# Patient Record
Sex: Female | Born: 1970 | Race: White | Hispanic: No | Marital: Single | Smoking: Current every day smoker
Health system: Southern US, Academic
[De-identification: ages and names within clinical notes are randomized; demographics above are authoritative.]

## PROBLEM LIST (undated history)

## (undated) DIAGNOSIS — G510 Bell's palsy: Secondary | ICD-10-CM

## (undated) DIAGNOSIS — I10 Essential (primary) hypertension: Secondary | ICD-10-CM

## (undated) HISTORY — DX: Bell's palsy: G51.0

## (undated) HISTORY — DX: Essential (primary) hypertension: I10

## (undated) HISTORY — PX: NO PAST SURGERIES: SHX2092

---

## 2022-07-21 ENCOUNTER — Emergency Department
Admission: EM | Admit: 2022-07-21 | Discharge: 2022-07-21 | Disposition: A | Discharge status: Home / Self Care | Payer: Self-pay | Attending: Physician Assistant | Admitting: Physician Assistant

## 2022-07-21 ENCOUNTER — Other Ambulatory Visit: Payer: Self-pay

## 2022-07-21 ENCOUNTER — Encounter (HOSPITAL_COMMUNITY): Payer: Self-pay | Admitting: Physician Assistant

## 2022-07-21 ENCOUNTER — Emergency Department (HOSPITAL_COMMUNITY): Payer: Self-pay

## 2022-07-21 DIAGNOSIS — G510 Bell's palsy: Secondary | ICD-10-CM | POA: Insufficient documentation

## 2022-07-21 DIAGNOSIS — G508 Other disorders of trigeminal nerve: Secondary | ICD-10-CM | POA: Insufficient documentation

## 2022-07-21 DIAGNOSIS — R9431 Abnormal electrocardiogram [ECG] [EKG]: Secondary | ICD-10-CM | POA: Insufficient documentation

## 2022-07-21 DIAGNOSIS — I10 Essential (primary) hypertension: Secondary | ICD-10-CM | POA: Insufficient documentation

## 2022-07-21 LAB — COMPREHENSIVE METABOLIC PANEL, NON-FASTING
ALBUMIN/GLOBULIN RATIO: 1.3 (ref 0.8–1.4)
ALBUMIN: 4.4 g/dL (ref 3.5–5.7)
ALKALINE PHOSPHATASE: 89 U/L (ref 34–104)
ALT (SGPT): 19 U/L (ref 7–52)
ANION GAP: 6 mmol/L (ref 4–13)
AST (SGOT): 17 U/L (ref 13–39)
BILIRUBIN TOTAL: 0.5 mg/dL (ref 0.3–1.2)
BUN/CREA RATIO: 17 (ref 6–22)
BUN: 11 mg/dL (ref 7–25)
CALCIUM, CORRECTED: 8.9 mg/dL (ref 8.9–10.8)
CALCIUM: 9.3 mg/dL (ref 8.6–10.3)
CHLORIDE: 107 mmol/L (ref 98–107)
CO2 TOTAL: 27 mmol/L (ref 21–31)
CREATININE: 0.63 mg/dL (ref 0.60–1.30)
ESTIMATED GFR: 109 mL/min/{1.73_m2} (ref >59)
GLOBULIN: 3.3 (ref 2.9–5.4)
GLUCOSE: 124 mg/dL — ABNORMAL HIGH (ref 74–109)
OSMOLALITY, CALCULATED: 280 mOsm/kg (ref 270–290)
POTASSIUM: 3.6 mmol/L (ref 3.5–5.1)
PROTEIN TOTAL: 7.7 g/dL (ref 6.4–8.9)
SODIUM: 140 mmol/L (ref 136–145)

## 2022-07-21 LAB — CBC WITH DIFF
BASOPHIL #: 0.1 10*3/uL (ref 0.00–0.30)
BASOPHIL %: 1 % (ref 0–3)
EOSINOPHIL #: 0.2 10*3/uL (ref 0.00–0.80)
EOSINOPHIL %: 3 % (ref 0–7)
HCT: 41.7 % (ref 37.0–47.0)
HGB: 14.3 g/dL (ref 12.5–16.0)
LYMPHOCYTE #: 1.8 10*3/uL (ref 1.10–5.00)
LYMPHOCYTE %: 25 % (ref 25–45)
MCH: 28.1 pg (ref 27.0–32.0)
MCHC: 34.2 g/dL (ref 32.0–36.0)
MCV: 82.2 fL (ref 78.0–99.0)
MONOCYTE #: 0.4 10*3/uL (ref 0.00–1.30)
MONOCYTE %: 6 % (ref 0–12)
MPV: 7.6 fL (ref 7.4–10.4)
NEUTROPHIL #: 4.8 10*3/uL (ref 1.80–8.40)
NEUTROPHIL %: 66 % (ref 40–76)
PLATELETS: 284 10*3/uL (ref 140–440)
RBC: 5.07 10*6/uL (ref 4.20–5.40)
RDW: 14.6 % (ref 11.6–14.8)
WBC: 7.3 10*3/uL (ref 4.0–10.5)
WBCS UNCORRECTED: 7.3 10*3/uL

## 2022-07-21 LAB — URINALYSIS, MACROSCOPIC
BILIRUBIN: NEGATIVE mg/dL
BLOOD: 0.03 mg/dL
GLUCOSE: NEGATIVE mg/dL
KETONES: NEGATIVE mg/dL
LEUKOCYTES: NEGATIVE WBCs/uL
NITRITE: NEGATIVE
PH: 6 (ref 5.0–9.0)
PROTEIN: 10 mg/dL
SPECIFIC GRAVITY: 1.021 (ref 1.002–1.030)
UROBILINOGEN: NORMAL mg/dL

## 2022-07-21 LAB — PTT (PARTIAL THROMBOPLASTIN TIME): APTT: 32 seconds (ref 26.0–36.0)

## 2022-07-21 LAB — PT/INR
INR: 1.07 (ref <=5.00)
PROTHROMBIN TIME: 12.4 seconds (ref 9.8–12.7)

## 2022-07-21 LAB — URINALYSIS, MICROSCOPIC
RBCS: 3 /hpf (ref <4)
SQUAMOUS EPITHELIAL: 1 /hpf (ref <28)
WBCS: 1 /hpf (ref <6)

## 2022-07-21 LAB — GOLD TOP TUBE

## 2022-07-21 LAB — TROPONIN-I: TROPONIN I: 5 ng/L (ref <15)

## 2022-07-21 MED ORDER — HYDROCHLOROTHIAZIDE 25 MG TABLET
ORAL_TABLET | ORAL | Status: AC
Start: 2022-07-21 — End: 2022-07-21
  Filled 2022-07-21: qty 1

## 2022-07-21 MED ORDER — CLONIDINE HCL 0.1 MG TABLET
ORAL_TABLET | ORAL | Status: AC
Start: 2022-07-21 — End: 2022-07-21
  Filled 2022-07-21: qty 1

## 2022-07-21 MED ORDER — HYDROCHLOROTHIAZIDE 25 MG TABLET
25.0000 mg | ORAL_TABLET | ORAL | Status: AC
Start: 2022-07-21 — End: 2022-07-21
  Administered 2022-07-21: 25 mg via ORAL

## 2022-07-21 MED ORDER — HYDROCHLOROTHIAZIDE 12.5 MG CAPSULE
12.5000 mg | ORAL_CAPSULE | Freq: Every day | ORAL | 0 refills | Status: DC
Start: 2022-07-21 — End: 2022-08-28

## 2022-07-21 MED ORDER — CLONIDINE HCL 0.1 MG TABLET
0.1000 mg | ORAL_TABLET | ORAL | Status: AC
Start: 2022-07-21 — End: 2022-07-21
  Administered 2022-07-21: 0.1 mg via ORAL

## 2022-07-21 NOTE — ED Nurses Note (Signed)
Patient discharged to home. A written copy of discharge instructions was given to the patient. Questions sufficiently answered as needed. Patient encouraged to follow up with PCP as indicated. Patient left department via ambulation.

## 2022-07-21 NOTE — ED Triage Notes (Signed)
States she has had numbness to left side of face since Saturday and numbness to tongue. Denies weakness or numbness anywhere else. Denies any confusion or slurred speech.

## 2022-07-21 NOTE — Discharge Instructions (Addendum)
Apply rewetting drops to the left eye throughout the day.  Tape the eye shut when you are sleeping.  Obtain a blood pressure cuff and check your blood pressure daily.  Keep a log.  Call schedule an appointment with a primary care provider listed above.  Return to the ER if any symptoms worsen or change.

## 2022-07-21 NOTE — ED Nurses Note (Signed)
Patient to EDM via ambulation with complaints of left sided facial numbness. Patient states on Saturday her tongue was numb and now her tongue, left side of face, and left side of neck are now numb. Patient denies any chest pain, SOB, headache, blurry vision, or slurred speech currently. Patient placed on bedside monitoring. Assessment as documented. Call bell in reach.

## 2022-07-21 NOTE — ED APP Handoff Note (Signed)
[facility]  Emergency Department  Provider in Triage Note    Name: Rhylei Mcquaig  Age: 51 y.o.  Gender: female     Subjective:   Carolyn Sylvia is a 51 y.o. female who presents with complaint of Numbness  .  Pt c/o L facial numbness X 3 days. Pt denies headache, visual changes, or numbness in further extremities.     Objective:   There were no vitals filed for this visit.   Focused Physical Exam shows NIH-0. PEARLA. Alert and oriented X 4. Pt is hypertensive in triage.       Plan:  Please see initial orders and work-up below.  This is to be continued with full evaluation in the main Emergency Department.     No current facility-administered medications for this encounter.     Results for orders placed or performed during the hospital encounter of 07/21/22 (from the past 24 hour(s))   CBC/DIFF    Narrative    The following orders were created for panel order CBC/DIFF.  Procedure                               Abnormality         Status                     ---------                               -----------         ------                     CBC WITH OJJK[[id number]]                                                                 Please view results for these tests on the individual orders.   URINALYSIS, MACROSCOPIC AND MICROSCOPIC W/CULTURE REFLEX    Specimen: Urine, Site not specified    Narrative    The following orders were created for panel order URINALYSIS, MACROSCOPIC AND MICROSCOPIC W/CULTURE REFLEX.  Procedure                               Abnormality         Status                     ---------                               -----------         ------                     URINALYSIS, MACROSCOPIC[[id number]]                                                       Please view results for  these tests on the individual orders.        Sidney Ace, FNP-C

## 2022-07-21 NOTE — ED Provider Notes (Signed)
[facility]  ED Primary Provider Note  History of Present Illness   Chief Complaint   Patient presents with    Numbness     Jodi Ramsey is a 51 y.o. female who had concerns including Numbness.  Arrival: The patient arrived by Car    Patient presents with complaints of numbness to the left side of the face in the left tongue that began 2 days ago.  Yesterday she developed weakness of the left side of the face.  She denies history of similar symptoms.  She denies any weakness or sensory changes of the extremities.  Denies fever, chills, sweats, chest pain, shortness a breath, nausea, vomiting, diarrhea, constipation, abdominal pain, abnormal bruising or bleeding, rash, headache, visual changes, dizziness.  She does not follow with a primary care provider and takes no medications on a daily basis.  She is no history of HSV, EBV, recent tick bite, hx of CVA or HTN.        Review of Systems   Pertinent positive and negative ROS as per HPI.  Historical Data   History Reviewed This Encounter: Medical History  Surgical History  Family History  Social History    Physical Exam   ED Triage Vitals [07/21/22 1226]   BP (Non-Invasive) (!) 220/99   Heart Rate 90   Respiratory Rate 20   Temperature 36.1 C (96.9 F)   SpO2 98 %   Weight 107 kg (235 lb)   Height 1.6 m (_0 )     Physical Exam  General: WDWN, appears stated age, alert in NAD. Cooperative throughout the exam.  Eyes:  no orbital edema or erythema, PERRL, EOMI, no scleral icterus or conjunctival injection,left eye lid doesn't close fully with normal blink response  Ears: External ear and EAC without erythema, edema or deformity. TM is clear with appropriate landmarks and without erythema or effusion.  Nose: Nares patent without discharge. Turbinants appear normal size and color.   Throat: Mucosa moist, soft and hard palate without erythema or lesions. Uvula midline. Tonsils and posterior pharyngeal wall are without edema,  erythema or exudate.    Neck: Supple and without LAD  CV: RRR, crisp S1, S2, no extra heart sounds,   Resp: LCTAB, no respiratory distress  Abdomen: normal contour without distension, BS x 4 normal, soft, nontender to light and deep palpation, without guarding or rebound tenderness  MSK: moves all extremities, muscle strength 5/5 throughout  Skin: warm and intact, of normal color, no lesions noted. Small red petechial lesions noted of the LE. Pt reports this is due to scratching from an allergy to socks and insects.   Neuro: normal tone, no involuntary movements, GCS 4,5,6. CN II-XII intact except for left CN V with decreased sensation to light touch in all 3 zones and left CN VII with decreased facial muscle strength on the entire left side.  Psych: speech and affect are appropriate, thought processes intact    Patient Data     Labs Ordered/Reviewed   COMPREHENSIVE METABOLIC PANEL, NON-FASTING - Abnormal; Notable for the following components:       Result Value    GLUCOSE 124 (*)     All other components within normal limits    Narrative:     Estimated Glomerular Filtration Rate (eGFR) is calculated using the CKD-EPI (2021) equation, intended for patients 58 years of age and older. If gender is not documented or "unknown", there will be no eGFR calculation.   URINALYSIS, MICROSCOPIC -  Abnormal; Notable for the following components:    MUCOUS Occasional (*)     All other components within normal limits   PT/INR - Normal    Narrative:     INR OF 2.0-3.0  RECOMMENDED FOR: PROPHYLAXIS/TREATMENT OF VENEOUS THROMBOSIS, PULMONARY EMBOLISM, PREVENTION OF SYSTEMIC EMBOLISM FROM ATRIAL FIBRILATION, MYOCARDIAL INFARCTION.    INR OF 2.5-3.5  RECOMMENDED FOR MECHANICAL PROSTHETIC HEART VALVES, RECURRENT SYSTEMIC EMBOLISM, RECURRENT MYOCARDIAL INFARCTION.     PTT (PARTIAL THROMBOPLASTIN TIME) - Normal   TROPONIN-I - Normal   URINALYSIS, MACROSCOPIC - Normal   CBC/DIFF    Narrative:     The following orders were created for panel  order CBC/DIFF.  Procedure                               Abnormality         Status                     ---------                               -----------         ------                     CBC WITH BSJG[[id number]]                                    Final result                 Please view results for these tests on the individual orders.   URINALYSIS, MACROSCOPIC AND MICROSCOPIC W/CULTURE REFLEX    Narrative:     The following orders were created for panel order URINALYSIS, MACROSCOPIC AND MICROSCOPIC W/CULTURE REFLEX.  Procedure                               Abnormality         Status                     ---------                               -----------         ------                     URINALYSIS, MACROSCOPIC[[id number]]      Normal              Final result               URINALYSIS, MICROSCOPIC[[id number]]      Abnormal            Final result                 Please view results for these tests on the individual orders.   CBC WITH DIFF   EXTRA TUBES    Narrative:     The following orders were created for panel order EXTRA TUBES.  Procedure  Abnormality         Status                     ---------                               -----------         ------                     GOLD TOP HERD[[id number]]                                    Final result                 Please view results for these tests on the individual orders.   GOLD TOP TUBE   PERFORM POC WHOLE BLOOD GLUCOSE     CT BRAIN WO IV CONTRAST   Final Result by Edi, Radresults In (08/21 1440)   NO ACUTE FINDINGS         One or more dose reduction techniques were used (e.g., Automated exposure control, adjustment of the mA and/or kV according to patient size, use of iterative reconstruction technique).         Radiologist location ID: [id number]         XR AP MOBILE CHEST   Final Result by Edi, Radresults In (08/21 1329)   NO ACUTE FINDINGS.         Radiologist location ID: [id number]         Medical Decision Making  Upon arrival patient was placed in a bed and evaluated with the history and physical exam.  Differential diagnosis includes Bell's palsy, trigeminal neuropathy, intracranial mass, CVA, hypertension, hypertensive urgency, hypertensive emergency.  Initial EKG and troponin are unremarkable.  Urinalysis shows no proteinuria.  Chemistry is unremarkable for kidney damage, CT of the brain is unremarkable indicating no hypertensive emergency.  No evidence of ischemic changes on noncontrasted CT.  Chest x-ray and CBC are unremarkable.  Patient's blood pressure remained elevated in the emergency room and therefore 0.1 mg of clonidine was administered.  Within 1 hour the blood pressure remained the same and therefore 25 mg of hydrochlorothiazide was administered .  The patient remained stable throughout ED course.    Amount and/or Complexity of Data Reviewed  Radiology:  Decision-making details documented in ED Course.  ECG/medicine tests: independent interpretation performed. Decision-making details documented in ED Course.    Risk  Prescription drug management.      ED Course as of 07/21/22 2019   Mon Jul 21, 2022   1234 ECG 12 LEAD  Normal sinus rhythm with a rate of 87.  No PR QRS or QT prolongation.  Right axis deviation.  No acute ST or T-wave changes.     1715 URINALYSIS, MACROSCOPIC AND MICROSCOPIC W/CULTURE REFLEX(!)  Unremarkable   1715 TROPONIN-I: 5   1715 aPTT: 32.0   1715 COMPREHENSIVE METABOLIC PANEL, NON-FASTING(!)  Unremarkable   1715 PROTHROMBIN TIME: 12.4   1715 INR: 1.07   1715 CBC/DIFF  Unremarkable     1717 ECG 12 LEAD   1718 CT BRAIN WO IV CONTRAST  IMPRESSION:  NO ACUTE FINDINGS     1718 XR AP MOBILE CHEST  IMPRESSION:  NO ACUTE FINDINGS.  Medications Administered in the ED   cloNIDine (CATAPRES) tablet (0.1 mg Oral Given 07/21/22 1745)   hydroCHLOROthiazide (HYDRODIURIL) tablet (25 mg Oral Given 07/21/22 1848)     Clinical Impression   Bell's palsy (Primary)    Trigeminal anesthesia   Hypertension, unspecified type       Disposition: Discharged

## 2022-07-22 LAB — ECG 12 LEAD
Atrial Rate: 87 {beats}/min
Calculated P Axis: 84 degrees
Calculated R Axis: 106 degrees
Calculated T Axis: 80 degrees
PR Interval: 164 ms
QRS Duration: 70 ms
QT Interval: 372 ms
QTC Calculation: 447 ms
Ventricular rate: 87 {beats}/min

## 2022-07-31 ENCOUNTER — Other Ambulatory Visit: Payer: Self-pay

## 2022-08-08 ENCOUNTER — Ambulatory Visit (INDEPENDENT_AMBULATORY_CARE_PROVIDER_SITE_OTHER): Payer: Self-pay | Admitting: NEUROLOGY

## 2022-08-28 ENCOUNTER — Encounter (INDEPENDENT_AMBULATORY_CARE_PROVIDER_SITE_OTHER): Payer: Self-pay | Admitting: NEUROLOGY

## 2022-08-31 ENCOUNTER — Other Ambulatory Visit: Payer: Self-pay

## 2022-09-22 ENCOUNTER — Encounter (INDEPENDENT_AMBULATORY_CARE_PROVIDER_SITE_OTHER): Payer: Self-pay | Admitting: NEUROLOGY

## 2022-09-22 ENCOUNTER — Ambulatory Visit (INDEPENDENT_AMBULATORY_CARE_PROVIDER_SITE_OTHER): Payer: Self-pay | Admitting: NEUROLOGY

## 2022-09-22 ENCOUNTER — Other Ambulatory Visit: Payer: Self-pay

## 2022-09-22 VITALS — BP 182/89 | HR 99 | Temp 98.3°F | Ht 63.0 in | Wt 220.2 lb

## 2022-09-22 DIAGNOSIS — I10 Essential (primary) hypertension: Secondary | ICD-10-CM

## 2022-09-22 DIAGNOSIS — G510 Bell's palsy: Secondary | ICD-10-CM

## 2022-09-22 DIAGNOSIS — R202 Paresthesia of skin: Secondary | ICD-10-CM

## 2022-09-22 NOTE — Progress Notes (Signed)
ASSESSMENT  1. LEFT-SIDED SENSORY DEFICITS:  She has a history of what sounds like progressive left side and sensory deficits beginning with an episode of neck and shoulder pain with radiating pain into the left upper extremity that then gave way to numbness and paresthesias throughout the arm.  This has been followed by numbness and paresthesias of the left lower extremity more recently.  Pin sensation is reduced throughout the left side.  There was no evidence of a sensory level.  Reflexes are relatively symmetric aside from the left Achilles which is reduced compared to right.  Given the completeness of the sensory deficits, localization would be concerning for cervical spine or brain.  While while the majority of her symptoms seemed to have had a subacute onset, I would still be concerned about a vascular event given her hypertensive urgency.  Is not inconceivable that she may have had a small right thalamic infarct affecting only sensation of the left side.  Another consideration would be inflammatory lesion of the brain or cervical spine given the acute to subacute onset.  I discussed that ultimately I would like to get an MRI of the brain and cervical spine.  Unfortunately, she is currently self pay.  She has had an employment change in may be eligible for Medicaid which she is looking into.  I will hold off on it imaging until insurances sorted.  In the meantime, I will schedule her for an EMG as cervical and lumbosacral radiculopathy is still high on the differential.  2. LEFT BELL'S PALSY:  Her acute onset left facial weakness is consistent with Bell's palsy as it was also accompanied by hyperacusis of the left ear.  This has improved quite a bit, however, there is still some notable weakness in the upper face.  I discussed that this may continue to improve up to 12 sometimes 18 months out from initial insult.  It is important to note, however, that many times recovery from Bell's palsy is  incomplete.    PLAN  Obtain NCS/EMG        We will plan to obtain an MRI of the brain cervical spine once     patient has insurance  2.   Continue to monitor    Follow-up to be determined at EMG visit    Thank you for allowing me to participate in your patient's care and please do not hesitate to contact me for any questions or concerns.    Patrick North, DO  Assistant Professor of Neurology  [facility]     I personally spent a total of 45 minutes today preparing to see the patient, in the encounter with the patient, and documenting after the visit.   ==========================================================================================================================================    NAME:  Jodi Ramsey  DOB:  [birth year]  VISIT DATE:  09/22/2022    CC:  Bell's Palsy    Patient seen in consultation at the request of Dr. Arva Chafe  History obtained from the patient and chart/records  Age of patient:  51 y.o.      HPI:   I had the pleasure of seeing your patient in neurology clinic for an outpatient consultation, who is a 51 y.o. year old female who was referred for evaluation of Bell's Palsy.  Please allow me to summarize the history for the record.    She states that her symptoms began back in July.  She began having significant left shoulder pain that radiated into the left arm.  Pain eventually subsided and was replaced with numbness and tingling throughout the left upper extremity.  This has persisted since that time.  At the end of August, she began developing left facial weakness that was preceded by tingling sensation in the tongue and accompanied by hyperacusis of the left ear.  This worsened over about a 2-3 days.  At which time she went to the ER.  She was found to have blood pressures with systolics in the 220s.  CT imaging was unremarkable.  No MRI was completed.  She then followed up with Dr. Kalman Jewels who diagnosed her with Bell's palsy and gave  her a steroid taper.  Since that time facial weakness has improved quite a bit though she reports it is not back to normal.  Two weeks following the Bell's palsy event, she began developing similar numbness and tingling in the left leg that has occurred in the left arm.  She denies any pain as occurred with the left upper extremity symptoms.  She denies any weakness.    ============================================================================================================================================  PMHx  Patient Active Problem List   Diagnosis    HTN (hypertension)    Bell's palsy     History reviewed. No past surgical history pertinent negatives.      Family Medical History:    None         Current Outpatient Medications   Medication Sig Dispense Refill    hydroCHLOROthiazide (HYDRODIURIL) 25 mg Oral Tablet Take 1 Tablet (25 mg total) by mouth Once a day       No current facility-administered medications for this visit.     No Known Allergies  Social History     Socioeconomic History    Marital status: Significant Other     Spouse name: Not on file    Number of children: Not on file    Years of education: Not on file    Highest education level: Not on file   Occupational History    Not on file   Tobacco Use    Smoking status: Every Day     Packs/day: .25     Types: Cigarettes    Smokeless tobacco: Never   Vaping Use    Vaping Use: Never used   Substance and Sexual Activity    Alcohol use: Never    Drug use: Never    Sexual activity: Not on file   Other Topics Concern    Not on file   Social History Narrative    Not on file     Social Determinants of Health     Financial Resource Strain: Not on file   Transportation Needs: Not on file   Social Connections: Not on file   Intimate Partner Violence: Not on file   Housing Stability: Not on file       ============================================================================================================================================  GENERAL  EXAMINATION  BP (!) 182/89 (Site: Right, Patient Position: Sitting, Cuff Size: Adult)   Pulse 99   Temp 36.8 C (98.3 F) (Temporal)   Ht 1.6 m (5\' 3" )   Wt 99.9 kg (220 lb 3.2 oz)   SpO2 96%   BMI 39.01 kg/m       Vital signs personally reviewed  General: No acute distress, alert  HEENT: Normocephalic, no scleral icterus  Pulmonary: No accessory muscle use, no tachypnea  Cardiovascular: Heart with regular rate & rhythm  Extremities: No significant edema, No cyanosis    NEUROLOGIC EXAM  On neurological exam, patient was awake, alert and answering questions appropriately  Speech was fluent, without dysarthria or aphasia.    CN  II: not directly tested, grossly intact  III, IV, VI: extraocular movements intact without nystagmus  V: intact to light touch  VII:  Mild upper facial weakness on the left  VIII: grossly intact  IX, X: symmetric palatal elevation  XI: normal strength of trapezius and sternocleidomastoid bilaterally  XII: tongue midline with full movements    MOTOR  Bulk: normal  Tone: normal  Abnormal Movements: none  Fasciculations: none    Strength:     MRC Grading Scale   Right Left   Deltoid 5 5   Biceps 5 5   Triceps 5 5   Wrist Extension 5 5   Wrist Flexion - -   Finger Extension 5 5   Finger Abduction 5 5   Finger Flexion 5 5   Hip Flexion 5 5   Hip Extension - -   Hip Abduction - -   Hip Adduction - -   Knee Extension 5 5   Knee Flexion 5 5   Ankle Dorsiflexion 5 5   Ankle Plantarflexion 5 5   Toe Extension 5 5   Toe Flexion - -     REFLEXES   Right Left   Biceps 2 2   Triceps 2 2   Brachioradialis 2 2   Patellar 2 2   Achilles 2 1   Plantar - -   Hoffman - -   Pectoralis - -   Jaw Jerk - -       SENSORY  Pin: diminished throughout the left side, no sensory level on back  Vibration: intact at toes    GAIT  General: casual, normal gait    COORDINATION  Finger nose finger: normal  Heel to shin:  normal    ================================================================================================================================LABS  Personal Review of prior labs is notable for:   2023  CMP largely within normal limits  CBC within normal limits  IMAGING  Personal Review of imaging is notable for:   Noncontrasted head CT July 21, 2022 - question slightly more atrophy I would consider normal for age  OTHER DIAGNOSTICS  Personal Review of other prior diagnostics is notable for:  Not applicable

## 2022-11-19 ENCOUNTER — Other Ambulatory Visit: Payer: Self-pay

## 2022-11-19 ENCOUNTER — Ambulatory Visit (INDEPENDENT_AMBULATORY_CARE_PROVIDER_SITE_OTHER): Payer: Self-pay | Admitting: NEUROLOGY

## 2022-11-19 DIAGNOSIS — R29818 Other symptoms and signs involving the nervous system: Secondary | ICD-10-CM

## 2022-11-19 NOTE — Procedures (Signed)
NEUROLOGY, [facility]  [address] [place] [address]    Procedure Note    Name: Brissia Delisa MRN:  [id number]   Date: 11/19/2022 Age: 51 y.o.  DOB:   07-20-1971       73532 - NEEDLE ELECTROMYOGRAPHY; 2 EXTREMITIES W/ OR W/O RELATED PARASPINAL AREAS (AMB ONLY)    Performed by: Dyke Brackett, DO  Authorized by: Dyke Brackett, DO        ELECTROMYOGRAPHY REPORT    DATE OF SERVICE:  11/19/2022      PERFORMING PHYSICIAN:  Birdie Hopes, D.O.      REFERRING PHYSICIAN:  Birdie Hopes, D.O.    HISTORY:  She is a 51 year old woman who is referred for electrodiagnostic testing for relatively acute onset hemi sensory deficits on the right side of her body.  Please see initial visit note from September 22, 2022 for detailed history.    EXAMINATION:  Please see initial visit note from September 22, 2022 for detailed neurologic examination.    NERVE CONDUCTION STUDIES:  1.Normal left  ulnar-D5, radial-snuff box and sural sensory nerve action potentials (SNAPs).  2.Normal left median-D2 sensory amplitudes with mildly slowed conduction velocity.  3.Normal left median-APB,   fibular-EDB and  tibial-AH compound muscle action potentials (CMAPs).  4.Normal left ulnar-ADM motor amplitudes with mildly slowed conduction velocity across the elbow segment and normal distal motor latencies.     ELECTROMYOGRAPHY:  Needle electromyography was performed of select muscles of the left arm including the deltoid, triceps, biceps, pronator teres and first dorsal interosseous.  Needle electromyography was performed of select muscles of the left leg including the medial gastrocnemius, tibialis anterior and vastus lateralis.       1.No abnormal spontaneous activity was seen  2.Polyphasic motor unit action  potentials (MUAPs) of long duration or long duration and large amplitude in the vastus lateralis on activation  3.Recruitment patterns were normal throughout  4.Normal needle EMG in the other muscles  studied.    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  3.   There is no evidence of a large-fiber polyneuropathy or cervical radiculopathy affecting the left upper extremity at this time.     CLINICAL NOTE:   The above findings were discussed with the patient.  As there is no explanation for her current sensory symptoms on electrodiagnostic testing, we will pursue MRI of the brain and cervical spine with and without contrast both to assess for a prior vascular event or perhaps signal changes concerning for inflammatory lesions.    Patrick North, DO  Assistant Professor of Neurology  [facility]

## 2022-12-07 ENCOUNTER — Ambulatory Visit (HOSPITAL_COMMUNITY): Payer: Self-pay

## 2022-12-09 ENCOUNTER — Telehealth (INDEPENDENT_AMBULATORY_CARE_PROVIDER_SITE_OTHER): Payer: Self-pay | Admitting: NEUROLOGY

## 2022-12-25 IMAGING — MR MRI CERVICAL SPINE WITHOUT AND WITH CONTRAST
5 of 8 series · 28 of 48 positions shown · IV contrast (gadavist)
Comparison: None available.

﻿EXAM:  [id number]   MRI CERVICAL SPINE WITHOUT AND WITH CONTRAST
INDICATION: Left-sided numbness.  Neck pain.
TECHNIQUE: Multiplanar, multisequential MRI of the C-spine was performed, including postcontrast images after injection of 10 mL Gadavist IV.

[Series 5: T2 · sagittal · 3.0mm · 0.75mm/px · 5 of 13 slices shown (1 of 2)]
[im 1/13]
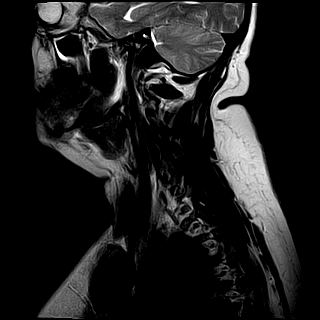
[im 4/13]
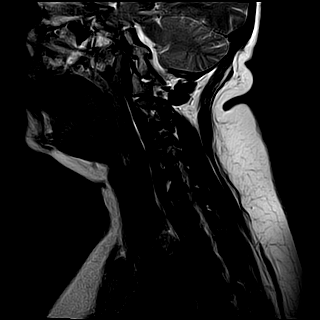
[im 7/13]
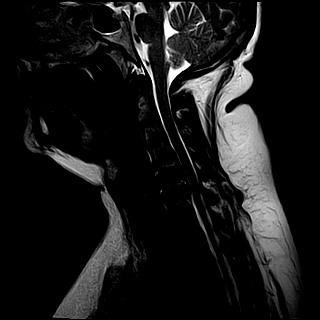
[im 10/13]
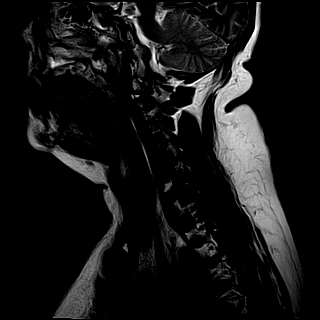
[im 13/13]
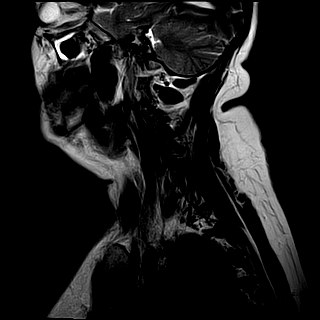

[Series 6: T1 · sagittal · 3.0mm · 0.47mm/px · 4 of 13 slices shown]
[im 1/13]
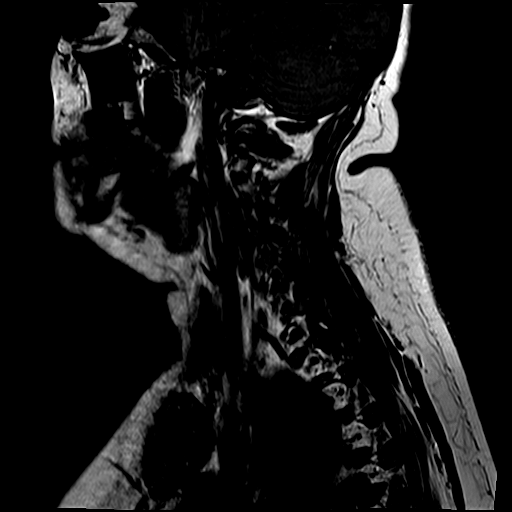
[im 5/13]
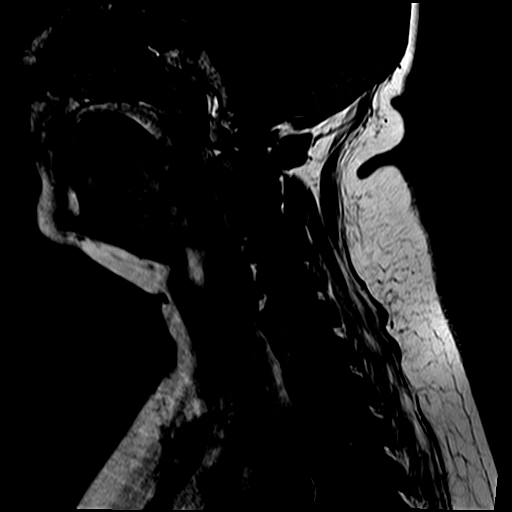
[im 9/13]
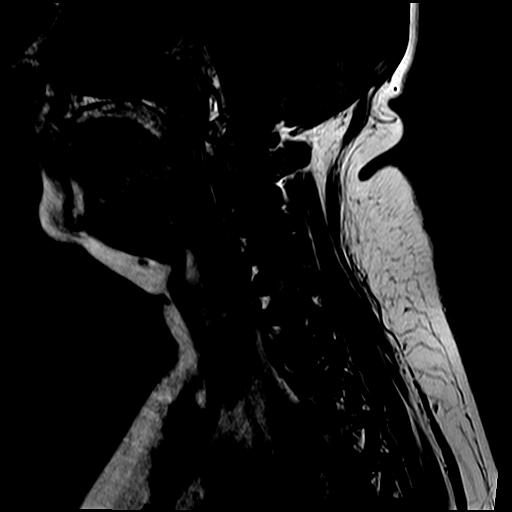
[im 13/13]
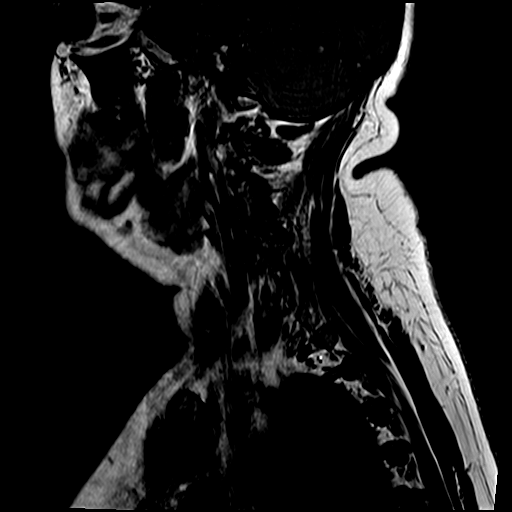

[Series 8: T1 fat-sat · sagittal · 3.0mm · 0.75mm/px · 4 of 13 slices shown (1 of 2)]
[im 1/13]
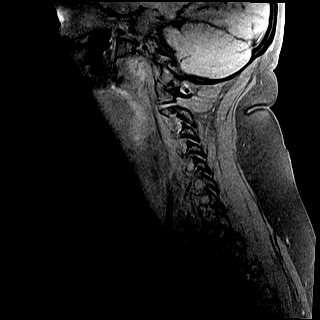
[im 5/13]
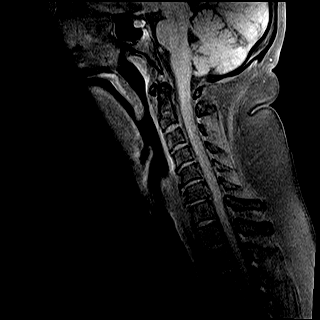
[im 9/13]
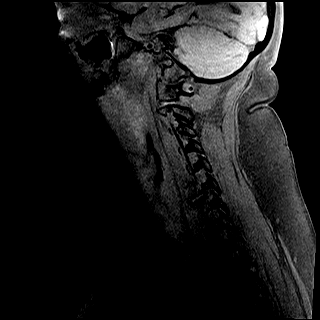
[im 13/13]
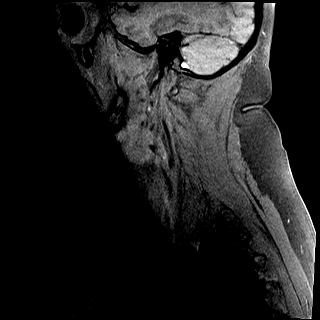

[Series 9: T2 · axial · 3.0mm · 0.35mm/px · z∈[-89,+4]mm · 9 of 26 slices shown (2 of 2)]
[im 1/26]
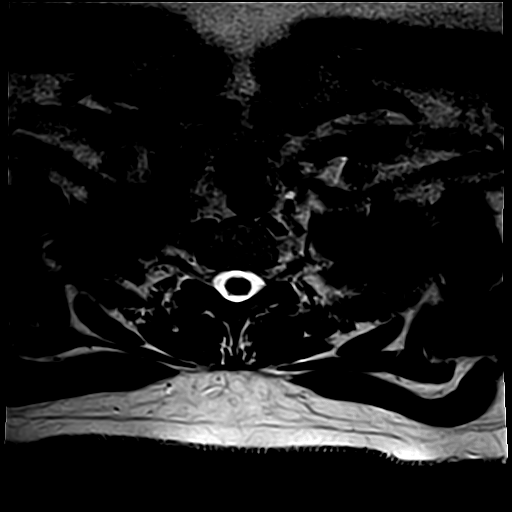
[im 4/26]
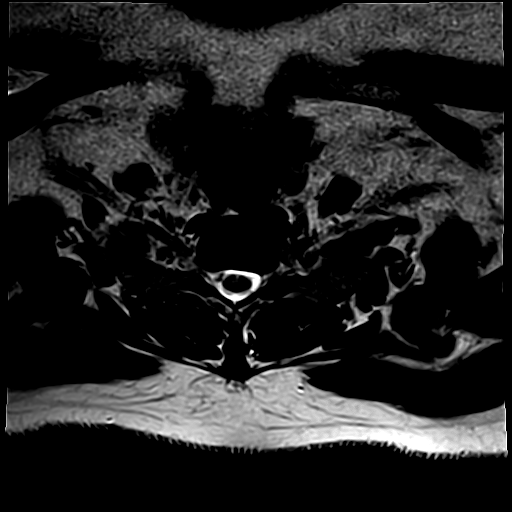
[im 7/26]
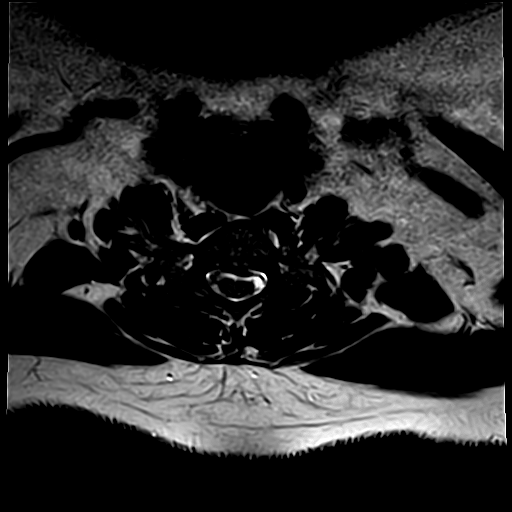
[im 10/26]
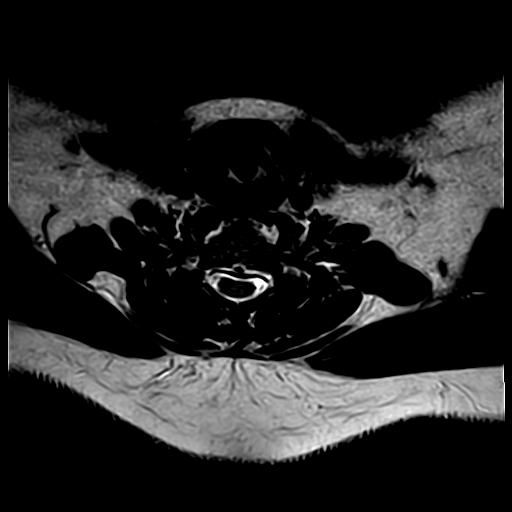
[im 13/26]
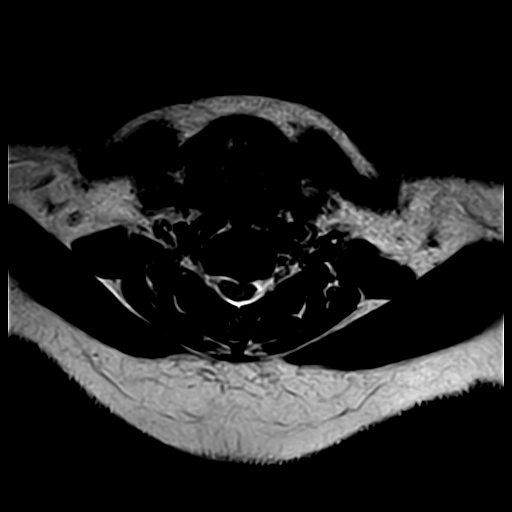
[im 16/26]
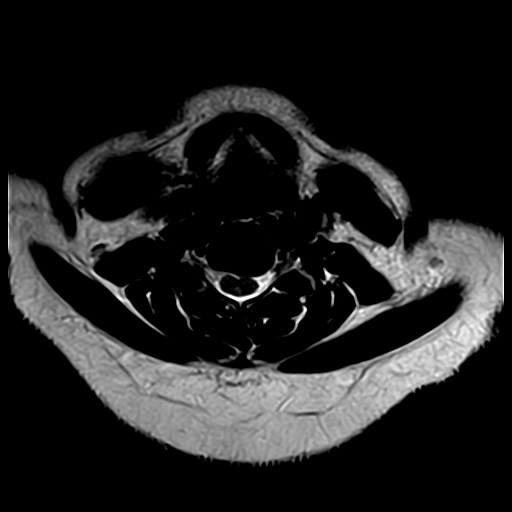
[im 19/26]
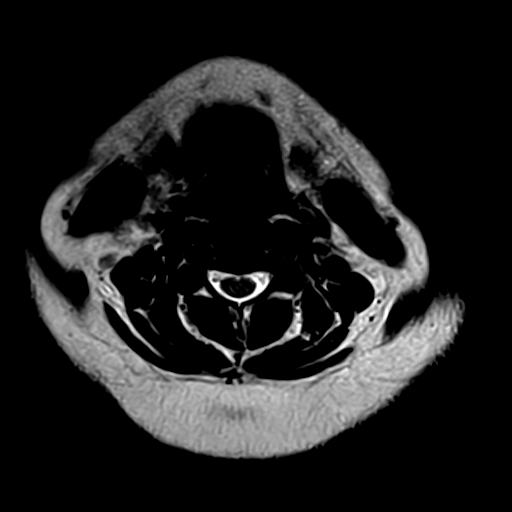
[im 22/26]
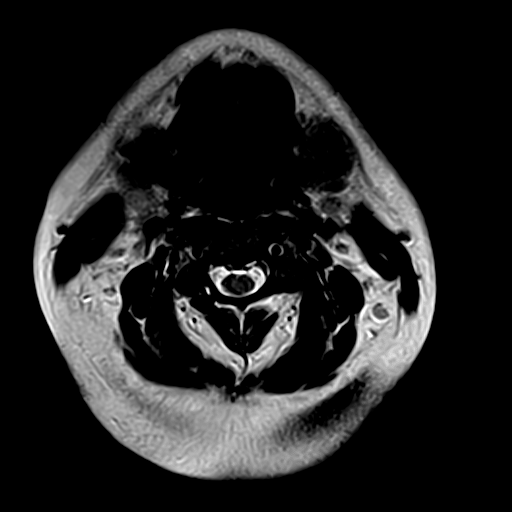
[im 26/26]
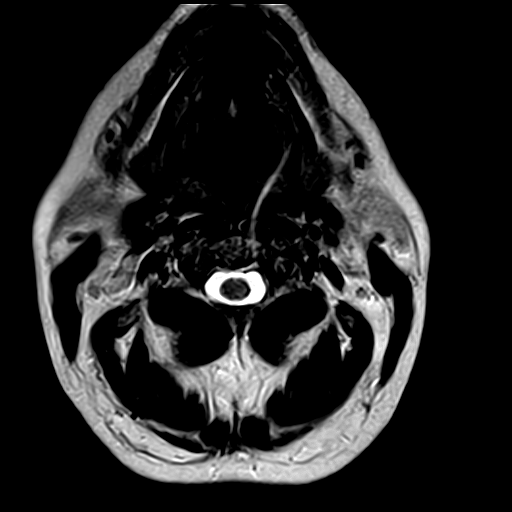

[Series 10: T1 fat-sat · axial · 3.0mm · 0.70mm/px · z∈[-89,-22]mm · 6 of 26 slices shown (2 of 2)]
[im 1/26]
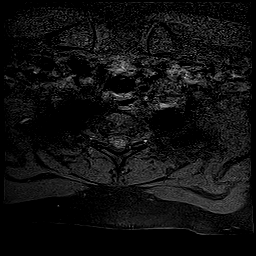
[im 4/26]
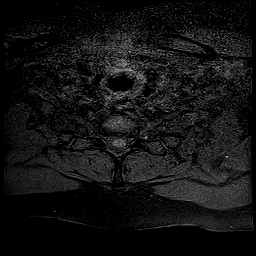
[im 7/26]
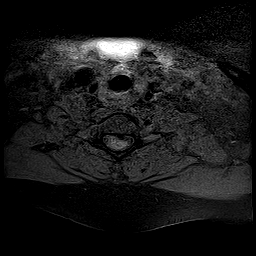
[im 10/26]
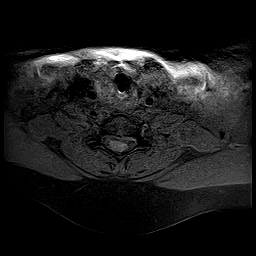
[im 16/26]
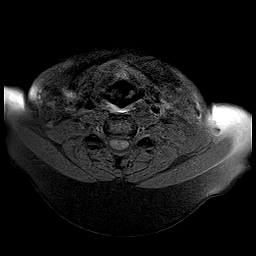
[im 19/26]
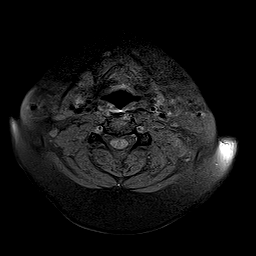

[28 of 48 positions shown; findings below may reference images not displayed]

FINDINGS: No acute bony lesions of cervical vertebrae.  Posterior fossa and foramen magnum structures are normal.

At C2-3 level, no focal disc lesions are seen. 

At C3-4 level, no focal disc lesions are seen. 

At C4-5 level, mild degenerative disc disease with asymmetric bulging to the left of the midline without significant compromise of thecal sac or neural foramina.

At C5-6 level, degenerative disc disease is noted with bulging annulus causing minimal compromise of thecal sac in the midline and right neural foramen. 

At C6-7 level, significant degenerative disc disease is noted with asymmetrically bulging annulus and osteophyte complex causing severe left foraminal stenosis.  This is impinging on the left C7 nerve root.  Compromise of thecal sac is noted due to the disc changes with AP diameter in the midline measuring 7.8 mm.

C7-T1 disc is normal.

Cervical spinal cord shows no focal lesions.  On the postcontrast study, mild bilateral cervical lymphadenopathy with enhancing cervical lymph nodes are noted, measuring up to 9 mm in short axis.
IMPRESSION: 1. No acute bone changes of cervical vertebrae.  Paravertebral muscle spasm is noted with loss of cervical lordosis. 

2. At C6-7 level, significant degenerative disc disease is noted with asymmetrically bulging annulus and osteophyte complex causing severe left foraminal stenosis.  This is impinging on the left C7 nerve root.  Compromise of thecal sac is noted due to the disc changes with AP diameter in the midline measuring 7.8 mm.

3. Findings at other disc levels are described above in detail.

4. No focal lesions of cervical spinal cord.

5. Bilateral cervical lymphadenopathy in the upper neck on postcontrast study.

## 2022-12-25 IMAGING — MR MRI BRAIN WITHOUT AND WITH CONTRAST
10 of 11 series · 38 of 48 positions shown · IV contrast (gadavist)
Comparison: None available.

﻿EXAM:  MRI BRAIN WITHOUT AND WITH CONTRAST
INDICATION: 51-year-old female with headache.  Left-sided numbness.  No history of trauma or malignancy.  No prior neurological diagnosis.
TECHNIQUE: Multiplanar, multisequential MRI of the brain was performed without and with 10 mL of Gadavist.

[Series 5: DWI · axial · 5.0mm · 1.35mm/px · z∈[-78,+48]mm · 9 of 88 slices shown (1 of 3)]
[im 1/88]
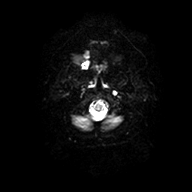
[im 16/88]
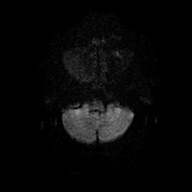
[im 24/88]
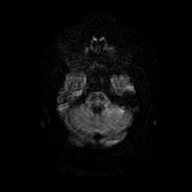
[im 40/88]
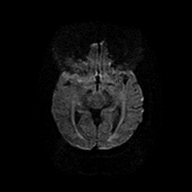
[im 48/88]
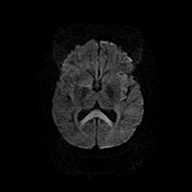
[im 64/88]
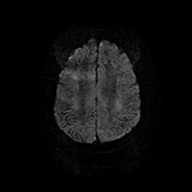
[im 72/88]
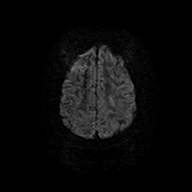
[im 80/88]
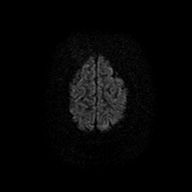
[im 88/88]
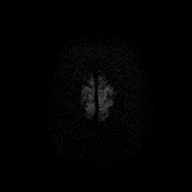

[Series 6: DWI · axial · 5.0mm · 1.35mm/px · z∈[-78,+48]mm · 2 of 22 slices shown (2 of 3)]
[im 1/22]
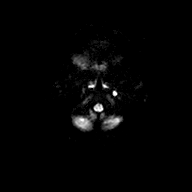
[im 22/22]
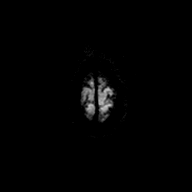

[Series 7: DWI · axial · 5.0mm · 1.35mm/px · z∈[-78,+48]mm · 3 of 22 slices shown (3 of 3)]
[im 1/22]
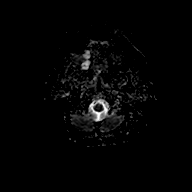
[im 11/22]
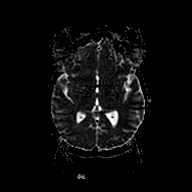
[im 22/22]
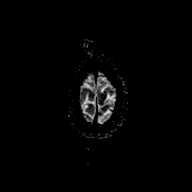

[Series 8: FLAIR · sagittal · 4.0mm · 0.75mm/px · 4 of 26 slices shown (1 of 2)]
[im 1/26]
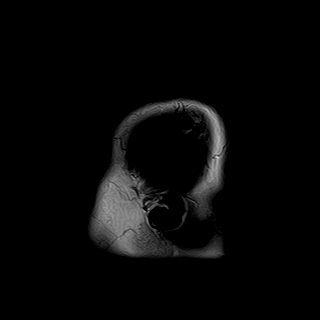
[im 9/26]
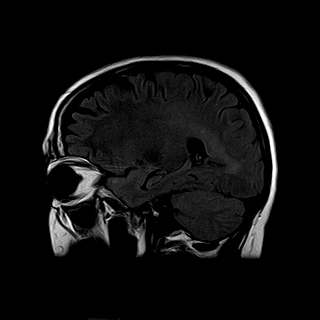
[im 17/26]
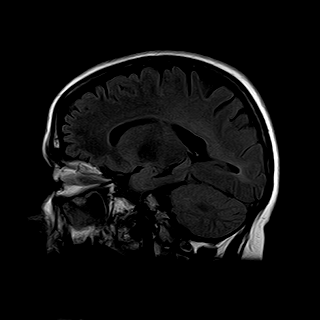
[im 26/26]
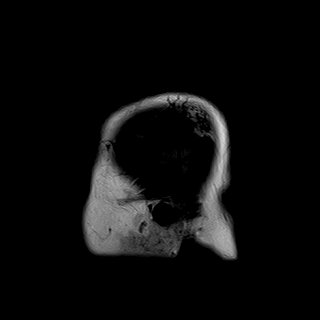

[Series 9: T2 · axial · 5.0mm · 0.43mm/px · z∈[-85,+53]mm · 4 of 24 slices shown (1 of 2)]
[im 1/24]
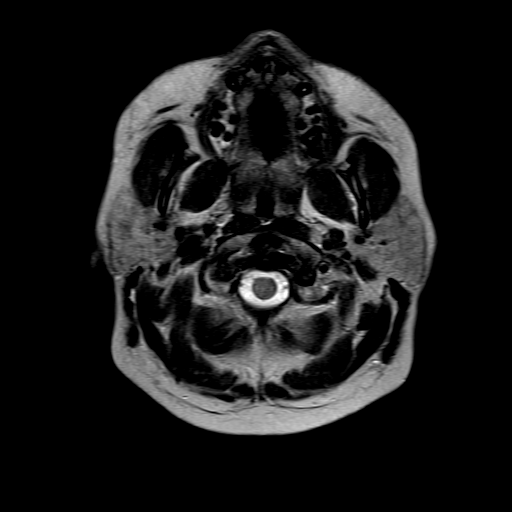
[im 8/24]
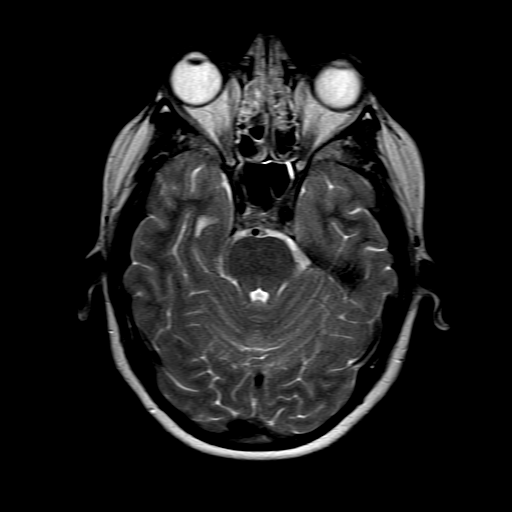
[im 16/24]
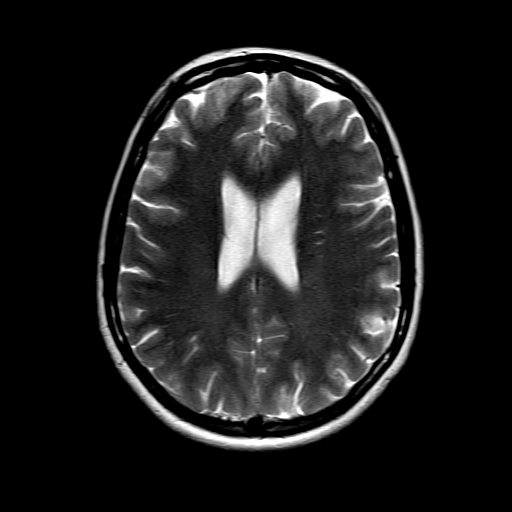
[im 24/24]
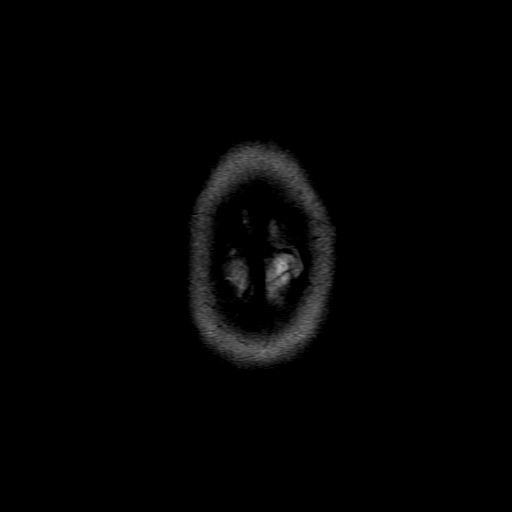

[Series 10: FLAIR · axial · 5.0mm · 0.76mm/px · z∈[-79,+47]mm · 3 of 22 slices shown (2 of 2)]
[im 1/22]
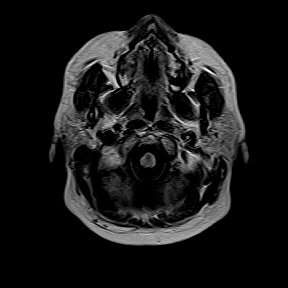
[im 11/22]
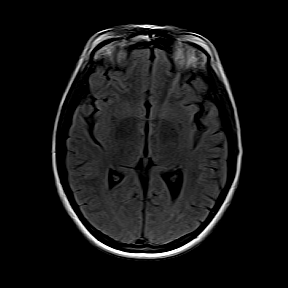
[im 22/22]
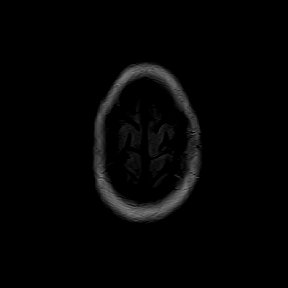

[Series 11: T1 · axial · 5.0mm · 0.69mm/px · z∈[-85,+53]mm · 4 of 24 slices shown]
[im 1/24]
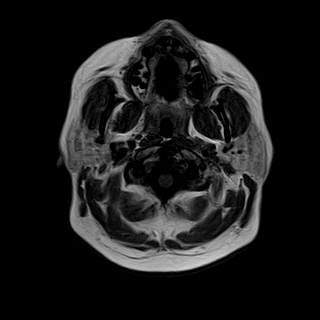
[im 8/24]
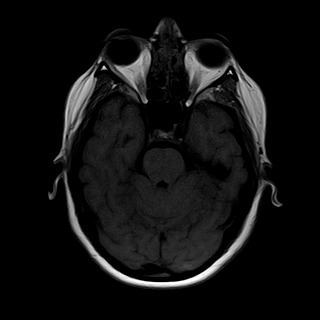
[im 16/24]
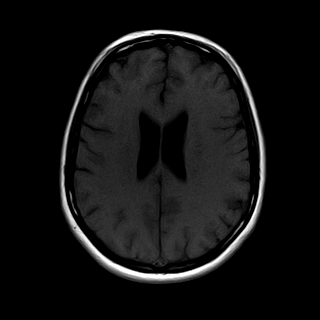
[im 24/24]
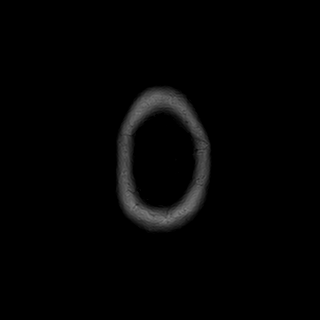

[Series 13: T2 · coronal · 6.0mm · 0.43mm/px · 4 of 24 slices shown (2 of 2)]
[im 1/24]
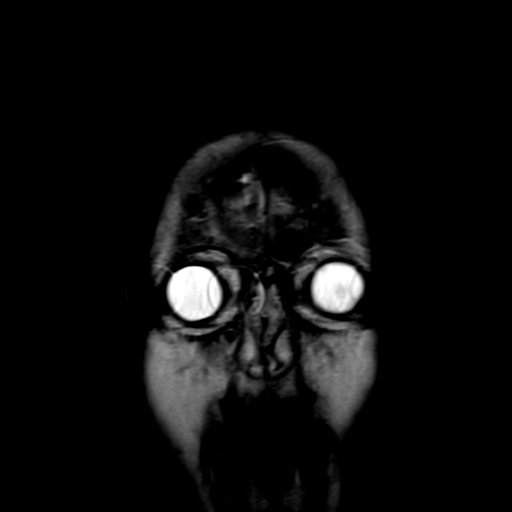
[im 8/24]
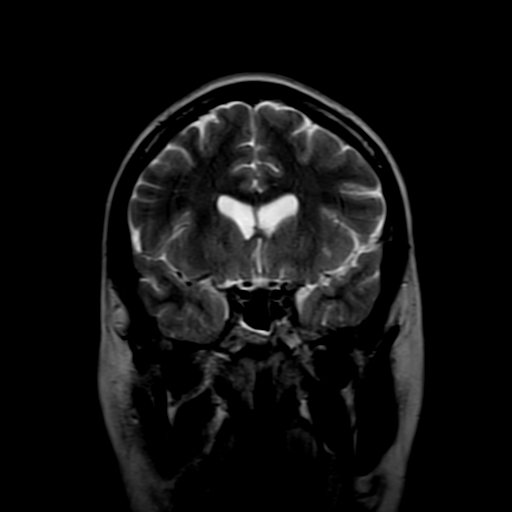
[im 16/24]
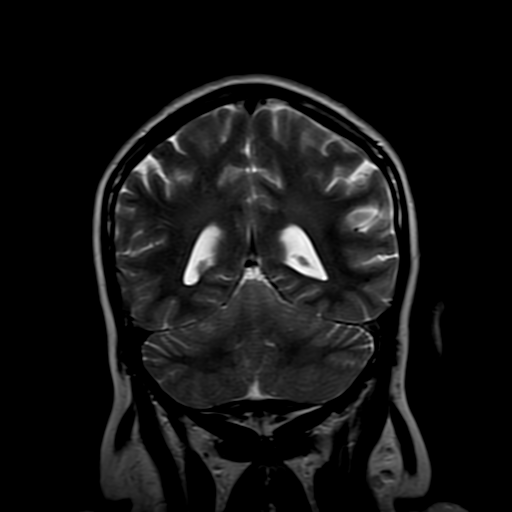
[im 24/24]
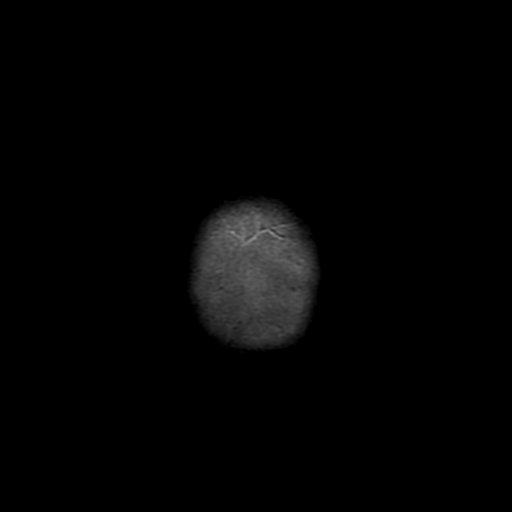

[Series 14: T1 post-contrast · axial · 5.0mm · 0.43mm/px · z∈[-88,+56]mm · 4 of 25 slices shown]
[im 1/25]
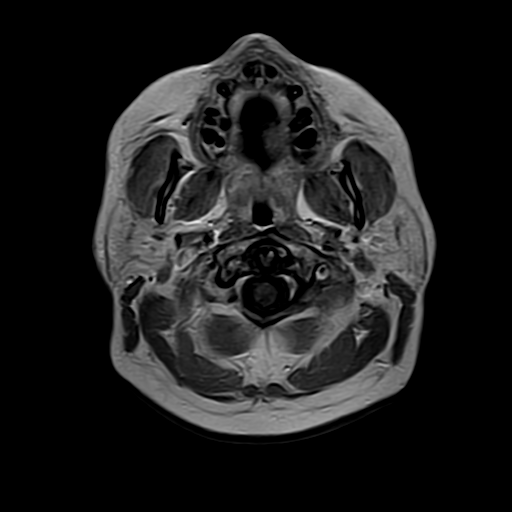
[im 9/25]
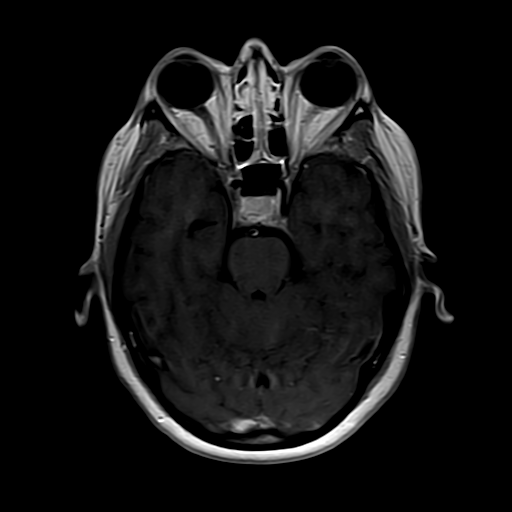
[im 17/25]
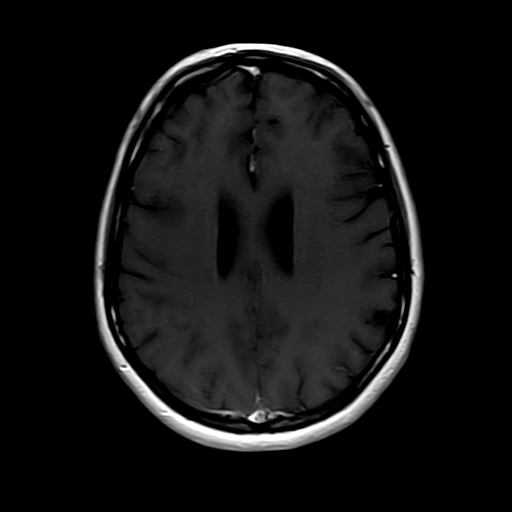
[im 25/25]
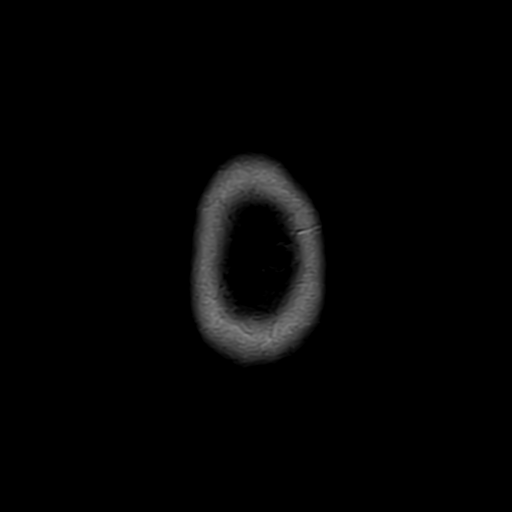

[Series 15: T1 fat-sat post-contrast · coronal · 5.0mm · 0.57mm/px · 1 of 24 slices shown]
[im 1/24]
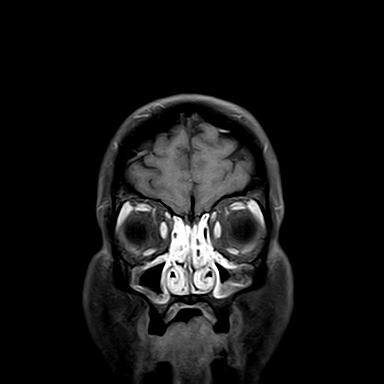

[38 of 48 positions shown; findings below may reference images not displayed]

FINDINGS: No acute ischemic change on the diffusion sequence.  No intracranial bleed or extra-axial collections are seen.  No evidence of ventriculomegaly or midline shift.

Axial and sagittal FLAIR images show no evidence of demyelinating lesions or chronic ischemic lesions.  No abnormal FLAIR signal lesions are seen in the posterior fossa including brainstem and cerebellum. 

Major arteries of circle of Willis and dural venous sinuses are patent.  No abnormal enhancement is noted on postcontrast examination.  Pituitary gland is normal in appearance.

Evidence of sinusitis of maxillary sinuses and ethmoid sinuses on both sides. Mastoids are clear.  Moderate inflammatory changes of sphenoid sinus.
IMPRESSION: 1. No acute ischemia, chronic ischemia or demyelinating lesions of the brain.

2. No intracranial space-occupying lesions, ventriculomegaly or abnormal enhancement.

3. Pansinusitis.

## 2023-02-19 ENCOUNTER — Other Ambulatory Visit: Payer: Self-pay

## 2023-02-19 ENCOUNTER — Ambulatory Visit (INDEPENDENT_AMBULATORY_CARE_PROVIDER_SITE_OTHER): Payer: Self-pay | Admitting: NEUROLOGY

## 2023-02-19 ENCOUNTER — Encounter (INDEPENDENT_AMBULATORY_CARE_PROVIDER_SITE_OTHER): Payer: Self-pay | Admitting: NEUROLOGY

## 2023-02-19 VITALS — BP 143/68 | HR 74 | Temp 98.4°F | Wt 192.2 lb

## 2023-02-19 DIAGNOSIS — G5710 Meralgia paresthetica, unspecified lower limb: Secondary | ICD-10-CM

## 2023-02-19 DIAGNOSIS — R29818 Other symptoms and signs involving the nervous system: Secondary | ICD-10-CM

## 2023-02-19 DIAGNOSIS — M5412 Radiculopathy, cervical region: Secondary | ICD-10-CM | POA: Insufficient documentation

## 2023-02-19 DIAGNOSIS — G510 Bell's palsy: Secondary | ICD-10-CM

## 2023-02-19 NOTE — Progress Notes (Signed)
ASSESSMENT  1. LEFT-SIDED SENSORY DEFICITS:  She has a history of what sounds like progressive left sided sensory deficits beginning with an episode of neck and shoulder pain with radiating pain into the left upper extremity that then gave way to numbness and paresthesias throughout the arm.  This has been followed by numbness and paresthesias of the left lower extremity. Pin sensation is reduced throughout the left side.  There was no evidence of a sensory level.  Reflexes are relatively symmetric aside from the left Achilles which is reduced compared to right.  Given the completeness of the sensory deficits, localization would be concerning for cervical spine or brain. MRI of the brain was negative for CVA or inflammatory lesions. Cervical spine MRI did show advanced degenerative disease with impingement of left C7 nerve root and what sounds like some degree of central canal stenosis. EMG was largely unremarkable. Her C7 radiculopathy could certainly explain her arm symptoms, however, would not explain facial and leg numbness. We will pursue occupational therpay for these findings and perhaps surgical referral though this may not be indicated if there is no pain or weakness.   2. LEFT BELL'S PALSY:  Her acute onset left facial weakness is consistent with Bell's palsy as it was also accompanied by hyperacusis of the left ear.  This has improved quite a bit, however, there is still some notable weakness in the upper face.  I discussed that this may continue to improve up to 12 sometimes 18 months out from initial insult.  It is important to note, however, that many times recovery from Bell's palsy is incomplete.  3. MERALGIA PARESTHETICA:  She has relatively new onset right lateral thigh paresthesias. Distribution is consistent with lateral cutaneous nerve of the thigh. Discussed conservative management. If fails or changes distribution, would Consider MR of the lumbar spine.     PLAN  Referral to occupational  therapy       2.   Continue to monitor  3.   Discussed conservative management        Can consider MR L spine in future depending on course    RTC 4-5 months    Thank you for allowing me to participate in your patient's care and please do not hesitate to contact me for any questions or concerns.    Adrian Prows, DO  Assistant Professor of Neurology  [facility]     I personally spent a total of 40 minutes today preparing to see the patient, in the encounter with the patient, and documenting after the visit.    [id number]: I will continue to be the provider focal point in managing the chronic complex neurological condition    ==========================================================================================================================================    NAME:  Jodi Ramsey  DOB:  October 23, 1971  VISIT DATE:  09/22/2022    CC:  Bell's Palsy    Patient seen in consultation at the request of Dr. Ellard Artis  History obtained from the patient and chart/records  Age of patient:  52 y.o.    INTERVAL: Since last visit, her left sided symptoms remain unchanged. Still involves the face, arm and leg. She does express more recent painful paresthesias of the right thigh. Describes as burning sensation. No pain in the left arm and leg.     HPI:   I had the pleasure of seeing your patient in neurology clinic for an outpatient consultation, who is a 52 y.o. year old female who was referred for evaluation of Bell's  Palsy.  Please allow me to summarize the history for the record.    She states that her symptoms began back in July.  She began having significant left shoulder pain that radiated into the left arm.  Pain eventually subsided and was replaced with numbness and tingling throughout the left upper extremity.  This has persisted since that time.  At the end of August, she began developing left facial weakness that was preceded by tingling sensation in the tongue and  accompanied by hyperacusis of the left ear.  This worsened over about a 2-3 days.  At which time she went to the ER.  She was found to have blood pressures with systolics in the [id number].  CT imaging was unremarkable.  No MRI was completed.  She then followed up with Dr. Milagros Evener who diagnosed her with Bell's palsy and gave her a steroid taper.  Since that time facial weakness has improved quite a bit though she reports it is not back to normal.  Two weeks following the Bell's palsy event, she began developing similar numbness and tingling in the left leg that has occurred in the left arm.  She denies any pain as occurred with the left upper extremity symptoms.  She denies any weakness.    ============================================================================================================================================  PMHx  Patient Active Problem List   Diagnosis    HTN (hypertension)    Bell's palsy     No past surgical history pertinent negatives on file.      Family Medical History:       Problem Relation (Age of Onset)    Bell's palsy Mother    SLE Maternal Aunt            Current Outpatient Medications   Medication Sig Dispense Refill    lisinopriL-hydrochlorothiazide (ZESTORETIC) 20-25 mg Oral Tablet Take 1 Tablet by mouth Once a day      potassium chloride (KLOR-CON) 10 mEq Oral Tablet Sustained Release Take 1 Tablet (10 mEq total) by mouth Once a day as directed       No current facility-administered medications for this visit.     No Known Allergies  Social History     Socioeconomic History    Marital status: Significant Other     Spouse name: Not on file    Number of children: Not on file    Years of education: Not on file    Highest education level: Not on file   Occupational History    Not on file   Tobacco Use    Smoking status: Every Day     Current packs/day: 0.25     Average packs/day: 0.3 packs/day for 30.0 years (7.5 ttl pk-yrs)     Types: Cigarettes    Smokeless tobacco: Never   Vaping Use     Vaping status: Never Used   Substance and Sexual Activity    Alcohol use: Never    Drug use: Never    Sexual activity: Not on file   Other Topics Concern    Not on file   Social History Narrative    Not on file     Social Determinants of Health     Financial Resource Strain: Not on file   Transportation Needs: Not on file   Social Connections: Not on file   Intimate Partner Violence: Not on file   Housing Stability: Not on file       ============================================================================================================================================  GENERAL EXAMINATION  BP (!) 143/68 (Site: Left, Patient Position: Sitting, Cuff Size:  Adult Large)   Pulse 74   Temp 36.9 C (98.4 F) (Temporal)   Wt 87.2 kg (192 lb 3.2 oz)   SpO2 97%   BMI 34.05 kg/m     Vital signs personally reviewed  General: No acute distress, alert  HEENT: Normocephalic, no scleral icterus  Extremities: No significant edema, No cyanosis    NEUROLOGIC EXAM  On neurological exam, patient was awake, alert and answering questions appropriately  Speech was fluent, without dysarthria or aphasia.    CN  II: not directly tested, grossly intact  III, IV, VI: extraocular movements intact without nystagmus  V: diminished to light touch in all distributions on the left  VII:  Mild upper facial weakness on the left  VIII: grossly intact  IX, X: symmetric palatal elevation  XI: normal strength of trapezius and sternocleidomastoid bilaterally  XII: tongue midline with full movements    MOTOR  Bulk: normal  Abnormal Movements: none    Strength:     MRC Grading Scale   Right Left   Deltoid 5 5   Biceps 5 5   Triceps 5 5   Wrist Extension 5 5   Wrist Flexion - -   Finger Extension 5 5   Finger Abduction 5 5   Finger Flexion 5 5   Hip Flexion 5 5   Hip Extension - -   Hip Abduction - -   Hip Adduction - -   Knee Extension 5 5   Knee Flexion 5 5   Ankle Dorsiflexion 5 5   Ankle Plantarflexion 5 5   Toe Extension - -   Toe Flexion - -      REFLEXES   Right Left   Biceps 2 2   Triceps 2 2   Brachioradialis 2 2   Patellar 2 2   Achilles 2 1   Plantar - -   Hoffman - -   Pectoralis - -   Jaw Jerk - -       SENSORY  Light Touch: diminished throughout the left side as well as lateral aspect of thigh     GAIT  General: casual, normal gait      ================================================================================================================================LABS  Personal Review of prior labs is notable for:   2023  CMP largely within normal limits  CBC within normal limits  IMAGING  Personal Review of imaging is notable for:   MRI Brain w/wo contrast  December 25, 2022    MRI Cervical Spine December 25, 2022      Noncontrasted head CT July 21, 2022 - question slightly more atrophy I would consider normal for age  OTHER DIAGNOSTICS  Personal Review of other prior diagnostics is notable for:  Not applicable

## 2023-02-24 ENCOUNTER — Ambulatory Visit (HOSPITAL_COMMUNITY): Payer: Self-pay

## 2023-02-27 ENCOUNTER — Ambulatory Visit (HOSPITAL_COMMUNITY): Payer: Self-pay

## 2023-02-27 ENCOUNTER — Ambulatory Visit
Admission: RE | Admit: 2023-02-27 | Discharge: 2023-02-27 | Disposition: A | Discharge status: Still In Hospital | Payer: Self-pay | Source: Ambulatory Visit | Attending: NEUROLOGY | Admitting: NEUROLOGY

## 2023-02-27 ENCOUNTER — Other Ambulatory Visit: Payer: Self-pay

## 2023-02-27 DIAGNOSIS — M5412 Radiculopathy, cervical region: Secondary | ICD-10-CM | POA: Insufficient documentation

## 2023-02-27 NOTE — OT Evaluation (Signed)
[facility]  Outpatient Occupational Therapy  [place], [id number]  [phone]  [phone]      Occupational Therapy Evaluation    Date: 02/27/2023  Patient's Name: Jodi Ramsey  Date of Birth: [birth year]  Occupational Therapy Evaluation     Diagnosis/Chief complaint  M 54.12 CERVICAL RADICULOPATHY    Patient Report of PLOF patient reports that before of July of 2023 she did not notice pain or deficits in her upper body or lower body that caused her to slow her pace, have increased pain, changes in sensation, "life was pretty normal and I was working full-time in [employer]. "    Patient Report of changes in functional level patient stating that sometime after July of 2023 she started noticing symptoms of neck pain that progressed to shoulder pain and pain that radiated down her entire left arm.  She would try to use modalities such as heat or ice, NSAIDs but found very poor symptom relief and symptoms were ongoing, progressing to numbness and tingling.  Patient states that in August she started experiencing facial paralysis, went to the ER and was diagnosed with Bell's palsy.  Patient reported at that time she did not have a PCP and sought one after ER visit.  Current PCP, Dr. Milagros Evener MD at [facility].  Several weeks after experiencing Bell's palsy she reported improvement with facial neuralgia and symptoms, but began to notice that she had numbness/tingling sensations throughout entire left side of her body.  Patient had follow-up MRI in February of 2024 with findings of DDD, spinal stenosis.  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  Patient has noticed increased episodes of dropping objects in left  hand at home and work settings.    Marital Status; Single    Occupation : full time job doing Programme researcher, broadcasting/film/video at [employer] requiring service to customers, carrying trays that weigh approximately 10 -20 lbs and patient uses left upper extremity for that task.  Other job requirements include wiping tables, carrying dishes, sweeping, mopping, carrying large ice buckets and other gross motor and fine motor tasks such as wrapping silverware, filling straw dispenser, stocking shelves.    Residence: HOUSE 2 LEVEL HOME    Number of stairs inside:  15      handrail: 1  Number of stairs outside: 3    handrail: 0     Hearing: Normal  SINCE EXPERIENCING DIAGNOSIS OF BELL'S PALSY, patient has noticed that there are times when sounds are, "amplified.  For example, when I am using a scoop upper to obtain ice from a bucket at work that sound seems really loud to me.  Glass plates clunking together are very loud.  It is just certain things at certain times that I notice seem to make me feel hypersensitive in my left ear. "    Vision: WEARS CONTACTS    Assistive device:  None    Adaptive equipment: None    Use of oxygen: is not on home oxygen therapy.    Precautions:  None    Pain rating: 4   Location:  Neck, left shoulder, left upper extremity, upper mid back described as stiff, sore, tingling sensation throughout left upper extremity, pain in back  described as dull ache.  Patient reports that she typically does not have to use NSAIDs on a daily basis but is daily aware that she has pain.  So far has been able to manage and describes pain as tolerable.    Cognition: WFL    Orientation:  Alert and oriented x 4    Follows directions: Simple  and Complex    Long term memory: Long term intact  Short term memory: Short term intact    ADLs    Feeding: Independent   Grooming:Independent   Bathing UB: Independent   Bathing [id number]   Toileting: Independent  Toileting hygiene:Independent  Toilet transfers:Independent  Tub  transfers:Independent patient has tub/shower combo.  Reports not having difficulty getting into/out tub/stepping into stepping out of tub.  Reports increased difficulty if sitting down in tub taking a bath and transitioning to standing but is able to do so without assist.  Patient reports no adaptive equipment or durable medical equipment setup in her home at this time.     Dressing UB: Independent  Dressing [id number]   Meal prep: Independent   Housework: Minimal Assistance patient reports having significant other who assist with heavier IADLs: Sweeping, laundry, dishes, vacuuming, taking out the garbage, lawn care.   Community activities: Independent    IADLs   Meal prep:Moderate Assistance    Bedmaking:Independent   Laundry:Minimal Assistance   Dishes:Minimal Assistance   Vacumming:Moderate Assistance    Sweeping:Moderate Assistance    Mopping:Moderate Assistance    Garbage removal:Moderate Assistance    Medication management:Independent   Grocery shopping:Modified Independent    Driving:Independent   Yardwork:Total Assistance    Functional Mobility Comment no overt deficits noted    Upper Extremity ROM : AROM WNL bilaterally with OT noting slight length discrepancy with left upper extremity during shoulder flexion.  Patient not reporting stiffness, tightness, pain during AROM but did describe ongoing numbness/tingling sensation in both hands, more  in the left.  GMC/FMC WFL    Upper Extremity Strength: RUE 5/5 throughout, left upper extremity 4/5 shoulder/elbow F/E, forearm pronation/supination, wrist F/E.  Grip strength assessed using dynamometer with patient in standing x3 trials per each hand with results as follows:  RUE 65 psi, LUE 70 psi WNL   STEREOGNOSIS assist in left hand with patient demonstrating 100% accuracy.    Balance scores:   Sitting: Normal   Standing:  Normal   Dynamic:  Normal    Endurance: Within normal limits    Tone: WNL    Patient goals:  TO MANAGE MY SYMPTOMS, PUT INTO PRACTICE THE  RELAXATION TECHNIQUES, ACTIVE STRETCHING PROGRAMS THAT I HAVE BEEN EDUCATED ON TODAY, OBTAIN HEALTH INSURANCE, FOLLOW-UP WITH MY NEUROLOGIST TO ASK QUESTIONS AND PROVIDE FEEDBACK TO DETERMINE IF I NEED FUTURE TREATMENT.       Primary language ENGLISH    Religious/cultural beliefs affecting care: No  Impression:  Patient is a 52 year old female presenting on this day for outpatient occupational therapy evaluation/follow-up with primary diagnosis cervical radiculopathy.  Patient demonstrated WNL ROM, strength, GMC/FMC skills in dominant RUE, minor discrepancy with shoulder flexion approximately 10/15 degrees variation in left shoulder.  OT also noted site lesion right shoulder.  Trigger points assessed and felt throughout traps, bilateral shoulders slightly rounded forward with patient in sitting but able to self-correct posture with verbal cuing.  MMT RUE 5/5 throughout, LUE 4/5 throughout without report of pain with assessment.  Grip strength WNL bilaterally assessed with dynamometer.  Stereognosis WNL left hand.  OT observed patient become  tearful as she expressed that she was experiencing episodes of increased anxiety without true understanding why momentary feelings of being overwhelmed are occurring and, begin " it really does not happen at work.  I think it was just coming here and not fully understanding everything that was involved.  Maybe fear of the unknown, I guess. "  OT provided patient with education to include active stretching programs for bilateral upper extremities, tendon gliding worksheet for bilateral hands, concluding with therapeutic activity with patient using 360 degree hula hoop for AROM stretching program while standing.  Patient provided feedback stating that active stretching did decrease overall symptoms in left upper extremity, stretching, "felt good nothing I will try to do a you suggested and practice active stretching at home before I go to work, in the evenings when I come home from  work, keep a journal of how I feel physically and emotionally, and, neurologist if there are any changes because I am not scheduled to see him again until September. "  OT not recommending further outpatient OT services at this time as symptoms, assessment do not warrant medical necessity.  Patient thanked OT for the time, information, education inability to practice whole-body stretching to increase patient's awareness of her body to include relaxation and breathing techniques to help with patient's anxiety.    Evaluation complexity:   Personal factors impacting POC:  None   Co-morbidities impacting POC: HEARING/VISUAL IMPAIRMENTS and hypertension managed with medication  Complexity of physical exam: INCLUDING MUSCULOSKELETAL SYSTEM (POSTURE, ROM, STRENGTH, HEIGHT/WEIGHT), INCLUDING NEUROMUSCULAR EXAM (BALANCE, GAIT, LOCOMOTION, MOBILITY), INCLUDING INTEGUMENTARY EXAM (TISSUE PLIABILITY, SCAR TISSUE, SKIN COLOR), INCLUDING LEVEL OF CONSCIOUSNESS AND ORIENTATION, and INCLUDING ACTIVITY/MOBILITY RESTRICTIONS   Clinical Presentation: STABLE   Evaluation Complexity: LOW-HISTORY 0, EXAMINATION 1-2, STABLE PRESENTATION        Total Session Time 60 and Timed code minutes 60        Intervention minutes: EVALUATION 30 minutes and THERAPEUTIC ACTIVITY 30 minutes    Geanie Logan, OT  02/27/2023, 17:11      Start of Service:    Certification:           From:    Through:        I certify the need for these services furnished under this plan of treatment and while under my care.    Referring Provider Signature:      Date:        Printed Name of Referring Provider:___________________________________________

## 2023-08-31 ENCOUNTER — Encounter (INDEPENDENT_AMBULATORY_CARE_PROVIDER_SITE_OTHER): Payer: Self-pay | Admitting: NEUROLOGY

## 2024-10-17 ENCOUNTER — Other Ambulatory Visit (HOSPITAL_COMMUNITY): Payer: Self-pay | Admitting: FAMILY PRACTICE

## 2024-10-17 DIAGNOSIS — Z1231 Encounter for screening mammogram for malignant neoplasm of breast: Secondary | ICD-10-CM

## 2024-11-03 ENCOUNTER — Ambulatory Visit (HOSPITAL_COMMUNITY): Payer: Self-pay

## 2024-11-28 ENCOUNTER — Ambulatory Visit: Payer: Self-pay
